# Patient Record
Sex: Female | Born: 1975 | Race: Black or African American | Hispanic: No | Marital: Single
Health system: Southern US, Community
[De-identification: ages and names within clinical notes are randomized; demographics above are authoritative.]

## PROBLEM LIST (undated history)

## (undated) DIAGNOSIS — I10 Essential (primary) hypertension: Secondary | ICD-10-CM

## (undated) DIAGNOSIS — F419 Anxiety disorder, unspecified: Secondary | ICD-10-CM

---

## 2020-12-03 ENCOUNTER — Emergency Department
Admission: EM | Admit: 2020-12-03 | Discharge: 2020-12-03 | Disposition: A | Discharge status: Home / Self Care | Payer: BC Managed Care – PPO | Attending: Emergency Medicine | Admitting: Emergency Medicine

## 2020-12-03 ENCOUNTER — Other Ambulatory Visit: Payer: Self-pay

## 2020-12-03 DIAGNOSIS — Z760 Encounter for issue of repeat prescription: Secondary | ICD-10-CM | POA: Insufficient documentation

## 2020-12-03 DIAGNOSIS — I10 Essential (primary) hypertension: Secondary | ICD-10-CM | POA: Insufficient documentation

## 2020-12-03 HISTORY — DX: Anxiety disorder, unspecified: F41.9

## 2020-12-03 HISTORY — DX: Essential (primary) hypertension: I10

## 2020-12-03 MED ORDER — LISINOPRIL-HYDROCHLOROTHIAZIDE 10-12.5 MG PO TABS: 1.0000 | ORAL_TABLET | Freq: Every day | ORAL | 11 refills | Status: DC

## 2020-12-03 MED ORDER — LORAZEPAM 0.5 MG PO TABS: 1.0000 mg | ORAL_TABLET | Freq: Two times a day (BID) | ORAL | 0 refills | Status: AC

## 2020-12-03 NOTE — ED Provider Notes (Signed)
[facility] Emergency Department Provider Note  ____________________________________________  Time seen: Approximately 8:48 PM  I have reviewed the triage vital signs and the nursing notes.   HISTORY  Chief Complaint Medication Refill    HPI Joyce Foster is a 45 y.o. female who presents emergency department for medication refill and referral for primary care and OB/GYN services.  Patient recently moved to the area from [place].  She takes daily antihypertensives with lisinopril HCTZ combo.  Patient also has a prescription for very as needed Ativan for panic attacks.  Patient is requesting refill of both meds.  She states that she is trying to establish with both primary care and OB/GYN.  She would like primary care to manage her anxiety and hypertension but states that she has uterine fibroids which causes significant menstrual cycle bleeding and cramping.  She states that she was told and for that she needed surgery.  She is interested in establishing care with OB/GYN.  No complaints currently.         Past Medical History:  Diagnosis Date  . Anxiety   . Hypertension     There are no problems to display for this patient.   History reviewed. No pertinent surgical history.  Prior to Admission medications   Medication Sig Start Date End Date Taking? Authorizing Provider  lisinopril-hydrochlorothiazide (ZESTORETIC) 10-12.5 MG tablet Take 1 tablet by mouth daily. 12/03/20 12/03/21 Yes Charidy Cappelletti, Charline Bills, PA-C  LORazepam (ATIVAN) 0.5 MG tablet Take 2 tablets (1 mg total) by mouth 2 (two) times daily. 12/03/20 12/03/21 Yes Graylon Amory, Charline Bills, PA-C    Allergies Patient has no allergy information on record.  No family history on file.  Social History     Review of Systems  Constitutional: No fever/chills Eyes: No visual changes. No discharge ENT: No upper respiratory complaints. Cardiovascular: no chest pain. Respiratory: no cough. No  SOB. Gastrointestinal: No abdominal pain.  No nausea, no vomiting.  No diarrhea.  No constipation. Genitourinary: Negative for dysuria. No hematuria Musculoskeletal: Negative for musculoskeletal pain. Skin: Negative for rash, abrasions, lacerations, ecchymosis. Neurological: Negative for headaches, focal weakness or numbness.  10 System ROS otherwise negative.  ____________________________________________   PHYSICAL EXAM:  VITAL SIGNS: ED Triage Vitals  Enc Vitals Group     BP 12/03/20 1748 120/83     Pulse Rate 12/03/20 1748 95     Resp 12/03/20 1748 18     Temp 12/03/20 1748 98.2 F (36.8 C)     Temp Source 12/03/20 1748 Oral     SpO2 12/03/20 1748 96 %     Weight 12/03/20 1749 145 lb (65.8 kg)     Height 12/03/20 1749 5\' 2"  (1.575 m)     Head Circumference --      Peak Flow --      Pain Score 12/03/20 1749 0     Pain Loc --      Pain Edu? --      Excl. in [place]? --      Constitutional: Alert and oriented. Well appearing and in no acute distress. Eyes: Conjunctivae are normal. PERRL. EOMI. Head: Atraumatic. ENT:      Ears:       Nose: No congestion/rhinnorhea.      Mouth/Throat: Mucous membranes are moist.  Neck: No stridor.    Cardiovascular: Normal rate, regular rhythm. Normal S1 and S2.  Good peripheral circulation. Respiratory: Normal respiratory effort without tachypnea or retractions. Lungs CTAB. Good air entry to the bases with no  decreased or absent breath sounds. Musculoskeletal: Full range of motion to all extremities. No gross deformities appreciated. Neurologic:  Normal speech and language. No gross focal neurologic deficits are appreciated.  Skin:  Skin is warm, dry and intact. No rash noted. Psychiatric: Mood and affect are normal. Speech and behavior are normal. Patient exhibits appropriate insight and judgement.   ____________________________________________   LABS (all labs ordered are listed, but only abnormal results are displayed)  Labs  Reviewed - No data to display ____________________________________________  EKG   ____________________________________________  RADIOLOGY   No results found.  ____________________________________________    PROCEDURES  Procedure(s) performed:    Procedures    Medications - No data to display   ____________________________________________   INITIAL IMPRESSION / ASSESSMENT AND PLAN / ED COURSE  Pertinent labs & imaging results that were available during my care of the patient were reviewed by me and considered in my medical decision making (see chart for details).  Review of the [place] CSRS was performed in accordance of the [place] prior to dispensing any controlled drugs.           Patient's diagnosis is consistent with encounter for medication refill, hypertension, uterine fibroids.  Patient is presenting for medication refill as well as referral for primary care and OB/GYN services as she recently moved to the area.  Patient will have meds refilled at this time.  I gave a small prescription for her Ativan for panic attacks but informed her that we cannot refill past this 1 time.  I have given her her blood pressure medication with multiple refills.  She states that she will follow-up with a primary care to continue prescriptions.  I have also given a referral for OB/GYN for management of her uterine fibroid..  No labs or imaging needed at this visit as patient is asymptomatic.  Follow-up with primary care and OB/GYN as needed.  Patient is given ED precautions to return to the ED for any worsening or new symptoms.     ____________________________________________  FINAL CLINICAL IMPRESSION(S) / ED DIAGNOSES  Final diagnoses:  Encounter for medication refill  Primary hypertension      NEW MEDICATIONS STARTED DURING THIS VISIT:  ED Discharge Orders         Ordered    lisinopril-hydrochlorothiazide (ZESTORETIC) 10-12.5 MG tablet  Daily        12/03/20 2043     LORazepam (ATIVAN) 0.5 MG tablet  2 times daily        12/03/20 2043              This chart was dictated using voice recognition software/Dragon. Despite best efforts to proofread, errors can occur which can change the meaning. Any change was purely unintentional.    Darletta Moll, PA-C 12/03/20 2051    Duffy Bruce, MD 12/06/20 [phone]

## 2020-12-03 NOTE — ED Triage Notes (Signed)
Pt comes with c/o needing medication refill. Pt states she just moved here and has no established PCP. Pt states she just needs to get her meds refilled.

## 2021-05-03 ENCOUNTER — Other Ambulatory Visit: Payer: Self-pay | Admitting: Obstetrics and Gynecology

## 2021-05-03 DIAGNOSIS — D219 Benign neoplasm of connective and other soft tissue, unspecified: Secondary | ICD-10-CM

## 2021-05-13 ENCOUNTER — Ambulatory Visit
Admission: RE | Admit: 2021-05-13 | Discharge: 2021-05-13 | Disposition: A | Discharge status: Home / Self Care | Payer: BC Managed Care – PPO | Source: Ambulatory Visit | Attending: Obstetrics and Gynecology | Admitting: Obstetrics and Gynecology

## 2021-05-13 ENCOUNTER — Other Ambulatory Visit: Payer: Self-pay

## 2021-05-13 DIAGNOSIS — D219 Benign neoplasm of connective and other soft tissue, unspecified: Secondary | ICD-10-CM | POA: Insufficient documentation

## 2021-05-13 MED ORDER — GADOBUTROL 1 MMOL/ML IV SOLN
7.0000 mL | Freq: Once | INTRAVENOUS | Status: AC | PRN
  Administered 2021-05-13: 7 mL via INTRAVENOUS

## 2022-04-20 ENCOUNTER — Emergency Department: Payer: Self-pay

## 2022-04-20 ENCOUNTER — Encounter: Payer: Self-pay | Admitting: Emergency Medicine

## 2022-04-20 ENCOUNTER — Emergency Department
Admission: EM | Admit: 2022-04-20 | Discharge: 2022-04-20 | Disposition: A | Discharge status: Home / Self Care | Payer: Self-pay | Attending: Emergency Medicine | Admitting: Emergency Medicine

## 2022-04-20 DIAGNOSIS — G43819 Other migraine, intractable, without status migrainosus: Secondary | ICD-10-CM | POA: Insufficient documentation

## 2022-04-20 DIAGNOSIS — I10 Essential (primary) hypertension: Secondary | ICD-10-CM | POA: Insufficient documentation

## 2022-04-20 LAB — CBC WITH DIFFERENTIAL/PLATELET
Abs Immature Granulocytes: 0.02 10*3/uL (ref 0.00–0.07)
Basophils Absolute: 0 10*3/uL (ref 0.0–0.1)
Basophils Relative: 0 %
Eosinophils Absolute: 0.1 10*3/uL (ref 0.0–0.5)
Eosinophils Relative: 1 %
HCT: 39.1 % (ref 36.0–46.0)
Hemoglobin: 12.5 g/dL (ref 12.0–15.0)
Immature Granulocytes: 0 %
Lymphocytes Relative: 30 %
Lymphs Abs: 1.7 10*3/uL (ref 0.7–4.0)
MCH: 25.6 pg — ABNORMAL LOW (ref 26.0–34.0)
MCHC: 32 g/dL (ref 30.0–36.0)
MCV: 80.1 fL (ref 80.0–100.0)
Monocytes Absolute: 0.4 10*3/uL (ref 0.1–1.0)
Monocytes Relative: 8 %
Neutro Abs: 3.4 10*3/uL (ref 1.7–7.7)
Neutrophils Relative %: 61 %
Platelets: 298 10*3/uL (ref 150–400)
RBC: 4.88 MIL/uL (ref 3.87–5.11)
RDW: 14.3 % (ref 11.5–15.5)
WBC: 5.6 10*3/uL (ref 4.0–10.5)
nRBC: 0 % (ref 0.0–0.2)

## 2022-04-20 LAB — TROPONIN I (HIGH SENSITIVITY): Troponin I (High Sensitivity): 4 ng/L (ref <18)

## 2022-04-20 LAB — URINALYSIS, ROUTINE W REFLEX MICROSCOPIC
Bilirubin Urine: NEGATIVE
Glucose, UA: NEGATIVE mg/dL
Hgb urine dipstick: NEGATIVE
Ketones, ur: NEGATIVE mg/dL
Leukocytes,Ua: NEGATIVE
Nitrite: NEGATIVE
Protein, ur: NEGATIVE mg/dL
Specific Gravity, Urine: 1.008 (ref 1.005–1.030)
pH: 5 (ref 5.0–8.0)

## 2022-04-20 LAB — COMPREHENSIVE METABOLIC PANEL
ALT: 12 U/L (ref 0–44)
AST: 16 U/L (ref 15–41)
Albumin: 4 g/dL (ref 3.5–5.0)
Alkaline Phosphatase: 50 U/L (ref 38–126)
Anion gap: 8 (ref 5–15)
BUN: 10 mg/dL (ref 6–20)
CO2: 26 mmol/L (ref 22–32)
Calcium: 9.5 mg/dL (ref 8.9–10.3)
Chloride: 105 mmol/L (ref 98–111)
Creatinine, Ser: 0.67 mg/dL (ref 0.44–1.00)
GFR, Estimated: >60 mL/min (ref >60)
Glucose, Bld: 102 mg/dL — ABNORMAL HIGH (ref 70–99)
Potassium: 3.6 mmol/L (ref 3.5–5.1)
Sodium: 139 mmol/L (ref 135–145)
Total Bilirubin: 0.6 mg/dL (ref 0.3–1.2)
Total Protein: 7.7 g/dL (ref 6.5–8.1)

## 2022-04-20 LAB — POC URINE PREG, ED: Preg Test, Ur: NEGATIVE

## 2022-04-20 MED ORDER — ONDANSETRON 4 MG PO TBDP: 4.0000 mg | ORAL_TABLET | Freq: Three times a day (TID) | ORAL | 0 refills | Status: AC | PRN

## 2022-04-20 MED ORDER — IBUPROFEN 600 MG PO TABS: 600.0000 mg | ORAL_TABLET | Freq: Four times a day (QID) | ORAL | 0 refills | Status: AC | PRN

## 2022-04-20 MED ORDER — LISINOPRIL 5 MG PO TABS: 5.0000 mg | ORAL_TABLET | Freq: Every day | ORAL | 0 refills | Status: AC

## 2022-04-20 MED ORDER — DIPHENHYDRAMINE HCL 50 MG/ML IJ SOLN
12.5000 mg | Freq: Once | INTRAMUSCULAR | Status: AC
  Administered 2022-04-20: 12.5 mg via INTRAVENOUS
  Filled 2022-04-20: qty 1

## 2022-04-20 MED ORDER — IOHEXOL 350 MG/ML SOLN
75.0000 mL | Freq: Once | INTRAVENOUS | Status: AC | PRN
  Administered 2022-04-20: 75 mL via INTRAVENOUS

## 2022-04-20 MED ORDER — SODIUM CHLORIDE 0.9 % IV BOLUS
1000.0000 mL | Freq: Once | INTRAVENOUS | Status: AC
  Administered 2022-04-20: 1000 mL via INTRAVENOUS

## 2022-04-20 MED ORDER — METOCLOPRAMIDE HCL 5 MG/ML IJ SOLN
5.0000 mg | Freq: Once | INTRAMUSCULAR | Status: AC
  Administered 2022-04-20: 5 mg via INTRAVENOUS
  Filled 2022-04-20: qty 2

## 2022-04-20 NOTE — ED Provider Notes (Signed)
[facility] Provider Note    Event Date/Time   First MD Initiated Contact with Patient 04/20/22 0703     (approximate)   History   Headache   HPI  Joyce Foster is a 46 y.o. female who comes in with intermittent headache on the right posterior head that started yesterday around 3 PM.  She reports taking Tylenol and ibuprofen with some relief but then it started coming back around 3 AM.  She reports that the pain radiates down into her neck and she has some dizziness with standing and ambulating.  Does not really feel off balance.  Denies having headaches previously.  She does report having a history of hypertension and anxiety and not having her alprazolam and her lisinopril.  She reports that there is point tenderness over the right parietal area.  Denies any vision changes, tick bites, fevers,.  Denies any chest pain, shortness of breath, abdominal pain.     Physical Exam   Triage Vital Signs: ED Triage Vitals  Enc Vitals Group     BP 04/20/22 0517 (!) 141/99     Pulse Rate 04/20/22 0517 86     Resp 04/20/22 0517 17     Temp 04/20/22 0517 97.9 F (36.6 C)     Temp Source 04/20/22 0517 Oral     SpO2 04/20/22 0517 98 %     Weight 04/20/22 0509 135 lb (61.2 kg)     Height 04/20/22 0509 '5\' 2"'$  (1.575 m)     Head Circumference --      Peak Flow --      Pain Score --      Pain Loc --      Pain Edu? --      Excl. in [place]? --     Most recent vital signs: Vitals:   04/20/22 0600 04/20/22 0730  BP: 119/77 (!) 140/81  Pulse: 79 82  Resp: 16 18  Temp:    SpO2: 97% 96%     General: Awake, no distress.  CV:  Good peripheral perfusion.  Resp:  Normal effort.  Abd:  No distention.  Other:  Cranial nerves II to XII are intact.  Equal strength in arms and legs.  Finger-to-nose intact bilaterally.  No temporal artery tenderness.  Pupils equal and reactive bilaterally extraocular movements are intact.  Patient's Romberg test is negative.  Ambulating  without any ataxia.  Patient has full range of motion of the neck.  Denies any rash   ED Results / Procedures / Treatments   Labs (all labs ordered are listed, but only abnormal results are displayed) Labs Reviewed  CBC WITH DIFFERENTIAL/PLATELET  COMPREHENSIVE METABOLIC PANEL  URINALYSIS, ROUTINE W REFLEX MICROSCOPIC  POC URINE PREG, ED  TROPONIN I (HIGH SENSITIVITY)     EKG  My interpretation of EKG:  Normal sinus rate of 73 without any ST elevation or T wave inversions, normal intervals  RADIOLOGY I have reviewed the CT head personally and interpreted no evidence of intercranial hemorrhage  PROCEDURES:  Critical Care performed: No  .1-3 Lead EKG Interpretation  Performed by: Vanessa [place], MD Authorized by: Vanessa [place], MD     Interpretation: normal     ECG rate:  60   ECG rate assessment: normal     Rhythm: sinus rhythm     Ectopy: none     Conduction: normal      MEDICATIONS ORDERED IN ED: Medications  sodium chloride 0.9 % bolus 1,000 mL (has  no administration in time range)  metoCLOPramide (REGLAN) injection 5 mg (has no administration in time range)  diphenhydrAMINE (BENADRYL) injection 12.5 mg (has no administration in time range)     IMPRESSION / MDM / ASSESSMENT AND PLAN / ED COURSE  I reviewed the triage vital signs and the nursing notes.   Patient's presentation is most consistent with acute presentation with potential threat to life or bodily function.   Differential includes migraine, hypertension headache, subarachnoid.  CT head ordered from triage without any evidence of intracranial hemorrhage but symptoms of been ongoing over the past 15 hours.  We discussed the lower sensitivity this far out.  I have low suspicion for subarachnoid given there was not sudden or severe in onset but given it does radiate into the neck with the complaint of dizziness I feel like a CTA would be good to help rule out any kind of dissection or aneurysm.  We  discussed CTA versus lumbar puncture and patient would like to proceed with CTA.  She denies any rashes or tick bites.  Pregnancy test negative.  CBC reassuring.  CMP reassuring.  Troponin negative and symptoms of been going on for greater than 3 hours.  UA negative for UTI  Patient was given IV fluid IV Reglan IV Benadryl.  Patient reports feeling better is up and is ambulatory.  IMPRESSION: No emergent large vessel occlusion or proximal hemodynamically significant stenosis.  At this time I very low suspicion for posterior stroke.  We discussed MRI but have opted to hold off but she understands return precautions in regards to this.  She is feeling much better.  She is requesting something for her blood pressure given she has been unable to get her blood pressure prescription and does not have a primary care doctor.  Explained to patient that her blood pressure is on the lower side today and I would be hesitant to restart a lisinopril 10 hydrochlorothiazide 25 due to the risk for hypotension.  However patient is adamant that she would like to have something at home.  We discussed doing a reduced dosage starting with lisinopril 5 mg and monitoring her blood pressures at home and that if they are staying elevated she can take a second dose of the lisinopril.  We also discussed the importance of getting a primary care doctor for follow-up.  Patient given the number for neurology if she continues to have headaches.  However at this time she reports her headache is resolved.   Considered admission but given reassuring work-up and stable vital signs and headache that is resolved patient feels comfortable with discharge home.  The patient is on the cardiac monitor to evaluate for evidence of arrhythmia and/or significant heart rate changes.      FINAL CLINICAL IMPRESSION(S) / ED DIAGNOSES   Final diagnoses:  Other migraine without status migrainosus, intractable     Rx / DC Orders   ED  Discharge Orders          Ordered    ibuprofen (ADVIL) 600 MG tablet  Every 6 hours PRN        04/20/22 1033    ondansetron (ZOFRAN-ODT) 4 MG disintegrating tablet  Every 8 hours PRN        04/20/22 1033    lisinopril (ZESTRIL) 5 MG tablet  Daily        04/20/22 1033             Note:  This document was prepared using Dragon voice recognition  software and may include unintentional dictation errors.   Vanessa Arrington, MD 04/20/22 [phone]

## 2022-04-20 NOTE — ED Notes (Signed)
Pt c/o intermittent headache w/ intermittent sharp pain in R posterior head, dizziness, and neck stiffness starting yesterday.  Pt reports taking OTC medications w/ some relief, but sts pain came back around 0300.

## 2022-04-20 NOTE — ED Triage Notes (Signed)
Pt with c/o right sided HA since last night, increasing in pain this AM with no relief of Ibuprofen and Naproxen. Pt with hx/o hypertension and sts she does not take her Lisinopril daily but only when she needs it. Pt denies blurred vision, extremity weakness. Pt A&O x4 with normal speech and appropriate answers.

## 2022-04-20 NOTE — Discharge Instructions (Addendum)
You can take Tylenol 1 g every 8 hours and the ibuprofen to help with pain.  Do not use it with additional naproxen.  You can use the Zofran as well to help with headaches and nausea.  Return to the ER if develop worsening symptoms including fevers, any other concerns.  Call neurology to make a follow-up appointment.  For your blood pressure your blood pressure looked normal today and I was hesitant to restart your normal blood pressure medicine but I am going to start you on a reduced dose of the lisinopril.  You should record your blood pressures once in the morning once at nighttime and monitor them to see if you need to go up on your blood pressure medication.  Is important that you make a primary care doctor appointment in order to follow-up with this.

## 2022-04-20 NOTE — ED Notes (Signed)
ED Provider at bedside. 

## 2022-04-20 NOTE — ED Notes (Signed)
Patient transported to CT 

## 2023-03-07 IMAGING — MR MR PELVIS WO/W CM
18 of 19 series · 43 of 48 positions shown · IV contrast (7 ml Gadavist)
Comparison: None.

CLINICAL DATA: Chronic pelvic pain.  Menorrhagia.  Fibroids.

EXAM:
MRI PELVIS WITHOUT AND WITH CONTRAST
TECHNIQUE: Multiplanar multisequence MR imaging of the pelvis was performed
both before and after administration of intravenous contrast.
CONTRAST:  7mL GADAVIST GADOBUTROL 1 MMOL/ML IV SOLN

[Series 2: T2 · coronal · 5.0mm · 1.56mm/px · 1 of 37 slices shown (1 of 5)]
[im 1/37]
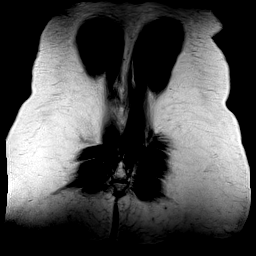

[Series 3: T2 · axial · 5.0mm · 0.66mm/px · 1 of 45 slices shown (2 of 5)]
[im 1/45]
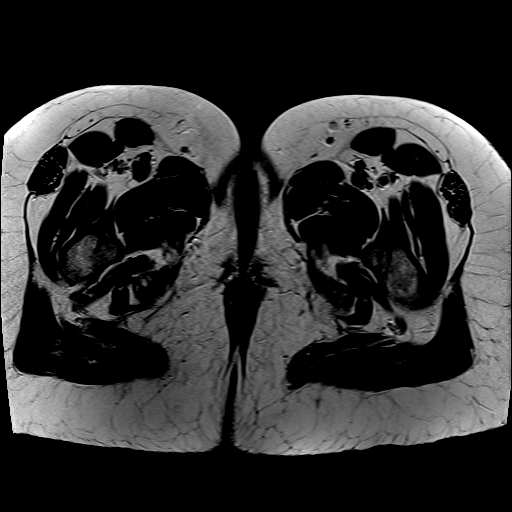

[Series 5: T2 · axial · 5.0mm · 0.66mm/px · 1 of 45 slices shown (3 of 5)]
[im 1/45]
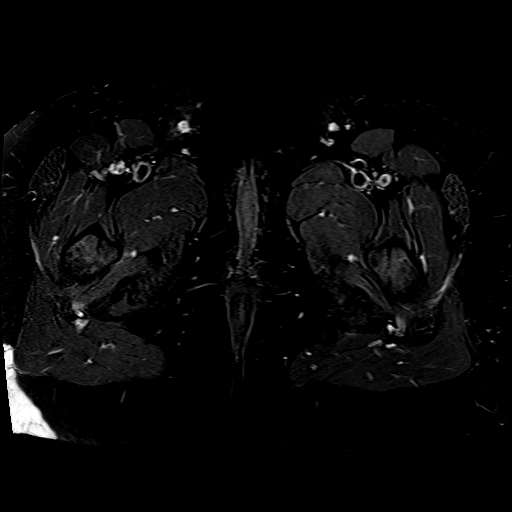

[Series 6: T2 · sagittal · 5.0mm · 0.94mm/px · 1 of 47 slices shown (4 of 5)]
[im 1/47]
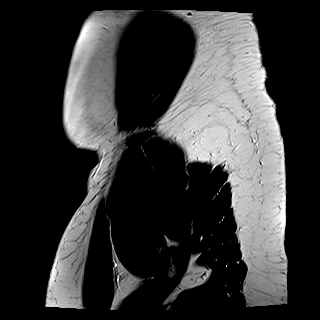

[Series 7: T2 · coronal · 5.0mm · 1.00mm/px · 1 of 39 slices shown (5 of 5)]
[im 1/39]
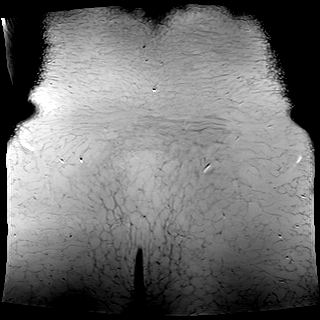

[Series 8: ax dwi_tracew · axial · 5.0mm · 1.42mm/px · z∈[-101,+169]mm · 5 of 138 slices shown]
[im 1/138]
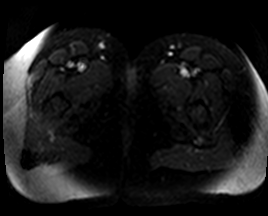
[im 35/138]
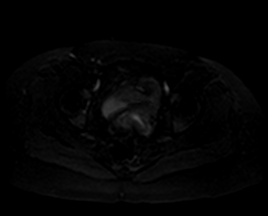
[im 69/138]
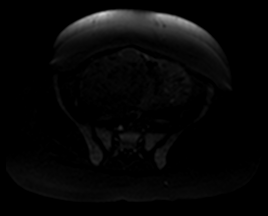
[im 103/138]
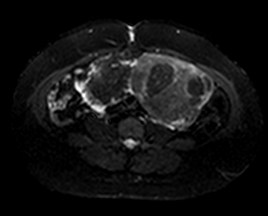
[im 138/138]
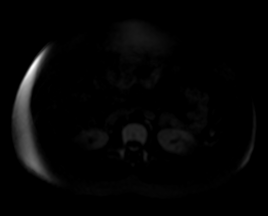

[Series 9: ax dwi_adc · axial · 5.0mm · 1.42mm/px · z∈[-101,+169]mm · 2 of 46 slices shown]
[im 1/46]
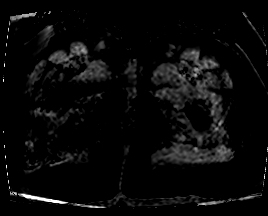
[im 46/46]
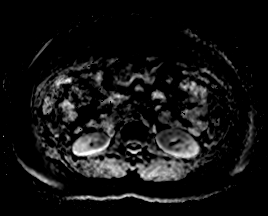

[Series 10: T1 dynamic · axial · 4.0mm · 1.00mm/px · z∈[-108,+176]mm · 3 of 72 slices shown (1 of 9)]
[im 1/72]
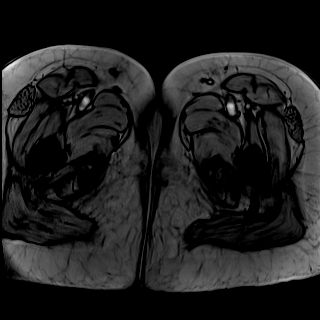
[im 36/72]
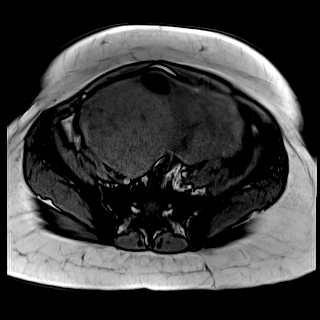
[im 72/72]
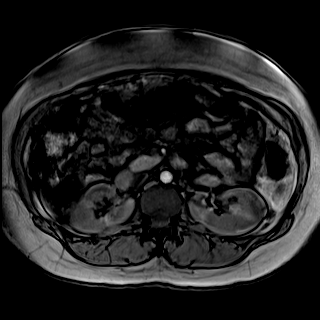

[Series 11: T1 dynamic · axial · 4.0mm · 1.00mm/px · z∈[-108,+176]mm · 3 of 72 slices shown (2 of 9)]
[im 1/72]
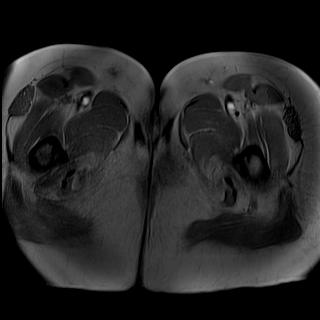
[im 36/72]
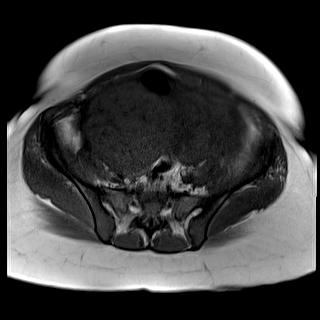
[im 72/72]
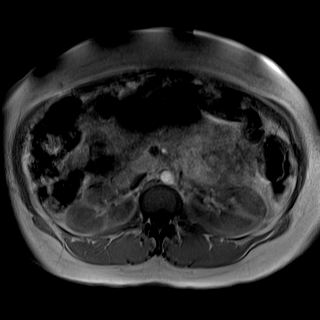

[Series 13: T1 dynamic · axial · 4.0mm · 1.00mm/px · z∈[-108,+176]mm · 3 of 72 slices shown (3 of 9)]
[im 1/72]
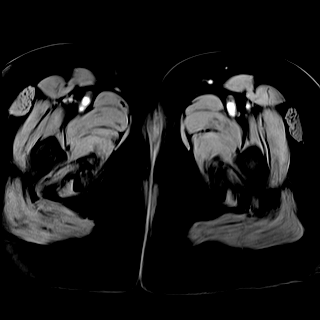
[im 36/72]
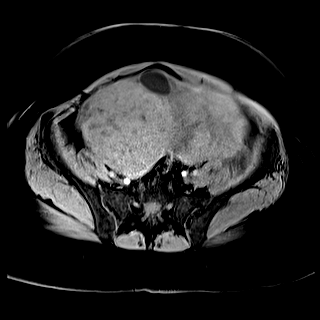
[im 72/72]
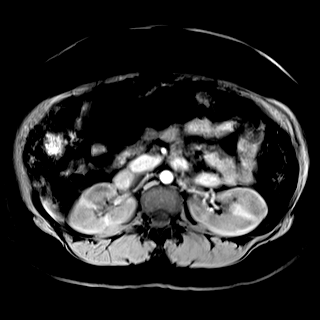

[Series 16: T1 dynamic · axial · 4.0mm · 1.00mm/px · z∈[-108,+176]mm · 3 of 72 slices shown (4 of 9)]
[im 1/72]
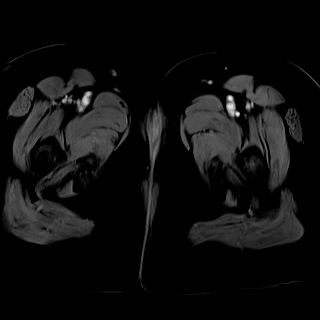
[im 36/72]
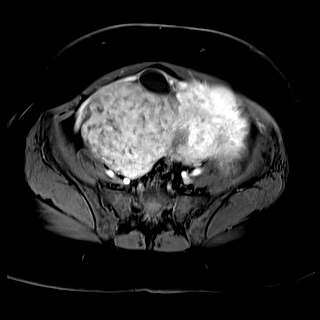
[im 72/72]
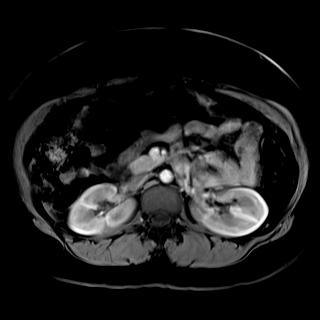

[Series 17: T1 dynamic · axial · 4.0mm · 1.00mm/px · z∈[-108,+176]mm · 3 of 72 slices shown (5 of 9)]
[im 1/72]
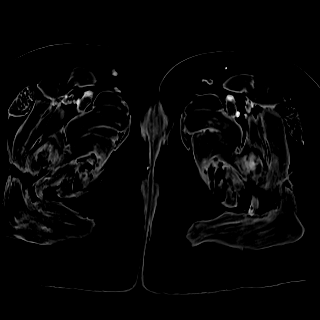
[im 36/72]
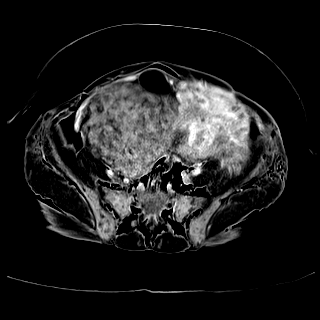
[im 72/72]
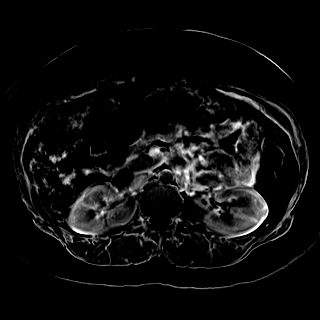

[Series 20: T1 dynamic · axial · 4.0mm · 1.00mm/px · z∈[-108,+176]mm · 3 of 72 slices shown (6 of 9)]
[im 1/72]
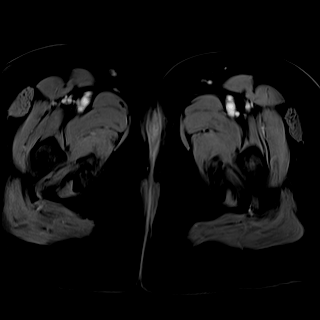
[im 36/72]
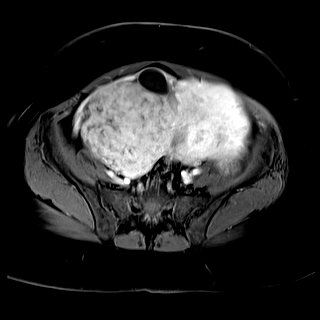
[im 72/72]
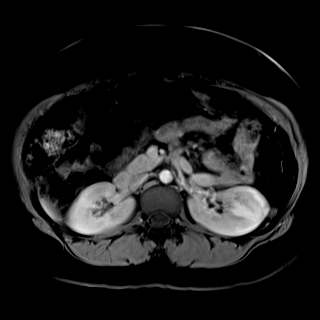

[Series 21: T1 dynamic · axial · 4.0mm · 1.00mm/px · z∈[-108,+176]mm · 3 of 72 slices shown (7 of 9)]
[im 1/72]
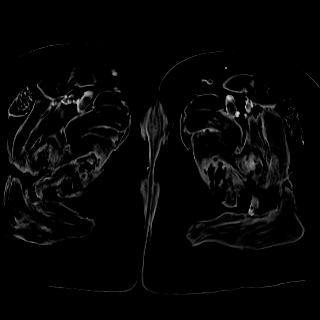
[im 36/72]
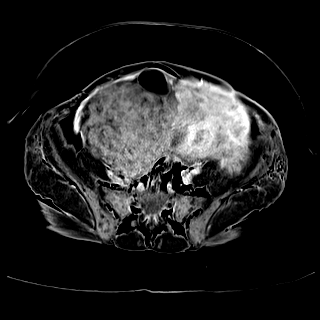
[im 72/72]
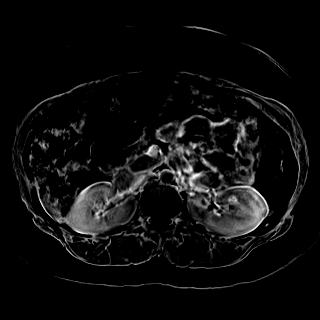

[Series 24: T1 dynamic · axial · 4.0mm · 1.00mm/px · z∈[-108,+176]mm · 3 of 72 slices shown (8 of 9)]
[im 1/72]
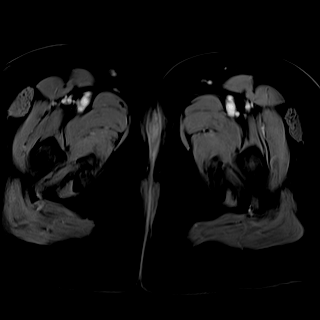
[im 36/72]
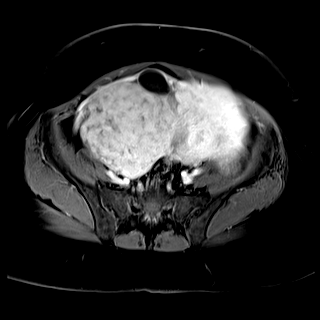
[im 72/72]
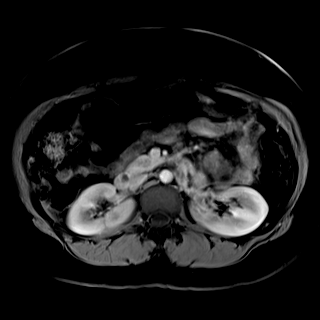

[Series 25: T1 dynamic · axial · 4.0mm · 1.00mm/px · z∈[-108,+176]mm · 3 of 72 slices shown (9 of 9)]
[im 1/72]
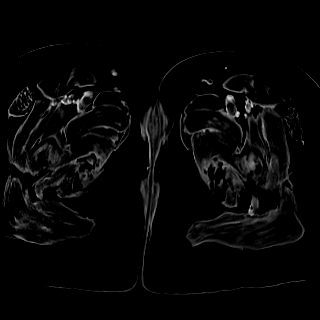
[im 36/72]
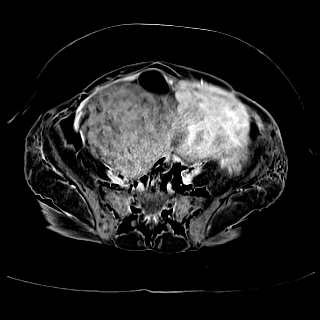
[im 72/72]
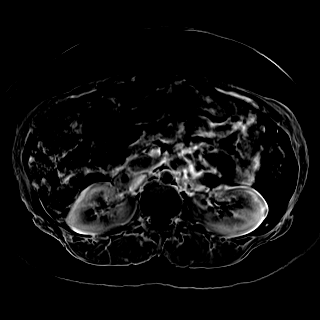

[Series 27: T1 fat-sat · sagittal · 4.0mm · 1.00mm/px · 3 of 72 slices shown]
[im 1/72]
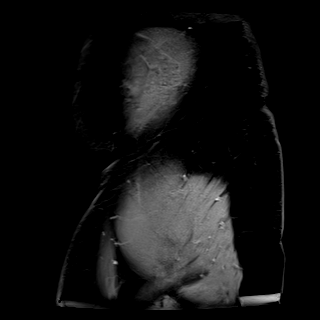
[im 36/72]
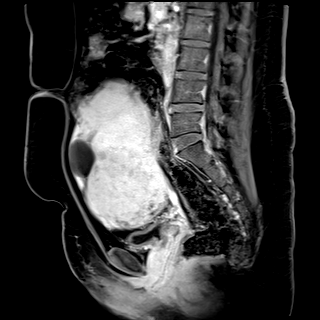
[im 72/72]
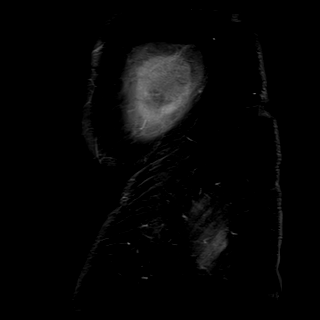

[Series 30: T2 post-contrast · sagittal · 5.0mm · 0.94mm/px · 1 of 47 slices shown]
[im 1/47]
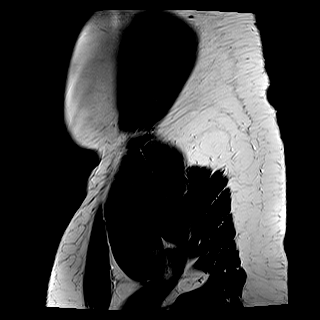

[43 of 48 positions shown; findings below may reference images not displayed]

FINDINGS: Lower Urinary Tract: No bladder or urethral abnormality identified.

Bowel:  Unremarkable visualized pelvic bowel loops.

Vascular/Lymphatic: No pathologically enlarged lymph nodes or other
significant abnormality.

Reproductive:

-- Uterus: Measures 17.4 x 10.5 by 19.0 cm (volume = 9211 cm^3).
Numerous fibroids are seen which involve the uterus diffusely.
Largest fibroid is pedunculated and arises from the right uterine
corpus, and measures 15.9 cm in maximum diameter. This has a broad
base of attachment to the uterus which measures approximately 5.5 cm
in thickness. This fibroid displaces the remainder of the uterus to
the left. Numerous other smaller fibroids are seen measuring up to
4.0 cm. A focal adenomyoma is also seen in the posterior uterine
fundus.

A polypoid soft tissue density measuring 1.8 x 1.3 cm is seen in the
proximal vagina which abuts the external cervical os (images
31-33/series 3). This could represent a cervical or vaginal mass.

-- Intracavitary fibroids: A 1.5 cm intracavitary fibroid is seen in
the fundal portion of the endometrial cavity.

-- Pedunculated fibroids: 15.9 cm pedunculated fibroid arising from
the right uterine corpus, as described above.

-- Fibroid contrast enhancement: All fibroids show contrast
enhancement, without significant degeneration/devascularization.

-- Right ovary: A 3.5 x 2.3 cm simple appearing cyst is seen along
the anterior aspect of the uterus, likely representing a physiologic
right ovarian cyst in a reproductive age female. No imaging
follow-up recommended.

-- Left ovary: An oblong simple appearing cyst is seen in the left
adnexa which measures 2.8 x 1.0 cm, this could represent a
physiologic ovarian cyst or mild hydrosalpinx. No imaging follow-up
recommended.

Other: Tiny amount of free fluid noted.

Musculoskeletal:  Unremarkable.
IMPRESSION: Markedly enlarged uterus. With diffuse involvement by numerous
fibroids measuring up to 15.9 cm.

15.9 cm pedunculated fibroid arising from the right uterine corpus,
which has a broad base of attachment and displaces the uterus to the
left. Numerous other smaller fibroids measuring up to 4.0 cm.

1.5 cm intracavitary fibroid in the uterine fundus.

Focal adenomyoma in the posterior uterine fundus.

1.8 cm polypoid soft tissue density in the proximal vagina abutting
the external cervical os. This could represent a cervical or vaginal
mass. Recommend direct visualization on pelvic exam.
